# Patient Record
Sex: Female | Born: 2005 | Hispanic: Yes | Marital: Single | Smoking: Never smoker
Health system: Southern US, Community
[De-identification: ages and names within clinical notes are randomized; demographics above are authoritative.]

## PROBLEM LIST (undated history)

## (undated) HISTORY — PX: NO PAST SURGERIES: SHX2092

---
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
## 2024-03-21 ENCOUNTER — Encounter: Payer: Self-pay | Admitting: Neurology

## 2024-05-19 ENCOUNTER — Encounter: Payer: MEDICAID | Admitting: Certified Nurse Midwife

## 2024-05-19 DIAGNOSIS — N924 Excessive bleeding in the premenopausal period: Secondary | ICD-10-CM

## 2024-05-21 ENCOUNTER — Encounter: Payer: Self-pay | Admitting: Certified Nurse Midwife

## 2024-08-11 NOTE — Progress Notes (Deleted)
   NEUROLOGY CONSULTATION NOTE  Sarah Schwartz MRN: [id number] DOB: 11-May-2006  Referring provider: Verneita Blade, PA-C Primary care provider: ***  Reason for consult:  headache  Assessment/Plan:   ***   Subjective:  Sarah Schwartz is a 18 year old ***-handed female who presents for headaches.  History supplemented by referring provider's note.  Onset:  *** Location:  *** Quality:  *** Intensity:  ***.  Aura:  *** Prodrome:  *** Postdrome:  *** Associated symptoms:  ***.  *** denies associated unilateral numbness or weakness. Duration:  *** Frequency:  *** Frequency of abortive medication: *** Triggers:  *** Relieving factors:  *** Activity:  ***  Past NSAIDS/analgesics:  *** Past abortive triptans:  *** Past abortive ergotamine:  *** Past muscle relaxants:  *** Past anti-emetic:  *** Past antihypertensive medications:  *** Past antidepressant medications:  *** Past anticonvulsant medications:  *** Past anti-CGRP:  *** Past vitamins/Herbal/Supplements:  *** Past antihistamines/decongestants:  *** Other past therapies:  ***  Current NSAIDS/analgesics:  *** Current triptans:  *** Current ergotamine:  *** Current anti-emetic:  *** Current muscle relaxants:  *** Current Antihypertensive medications:  *** Current Antidepressant medications:  *** Current Anticonvulsant medications:  *** Current anti-CGRP:  *** Current Vitamins/Herbal/Supplements:  *** Current Antihistamines/Decongestants:  *** Other therapy:  *** Birth control:  *** Other medications:  ***   Caffeine:  *** Alcohol:  *** Smoker:  *** Diet:  *** Exercise:  *** Depression:  ***; Anxiety:  *** Other pain:  *** Sleep hygiene:  ***  History of TBI/concussion:  *** Family history of headache:  *** Family history of cerebral aneurysm:  ***      PAST MEDICAL HISTORY: No past medical history on file.  PAST SURGICAL HISTORY: Past Surgical History:  Procedure Laterality Date   NO  PAST SURGERIES      MEDICATIONS: Current Outpatient Medications on File Prior to Visit  Medication Sig Dispense Refill   FEROSUL 325 (65 Fe) MG tablet Take 325 mg by mouth daily.     No current facility-administered medications on file prior to visit.    ALLERGIES: Allergies  Allergen Reactions   Albuterol Rash    FAMILY HISTORY: No family history on file.  Objective:  *** General: No acute distress.  Patient appears well-groomed.   Head:  Normocephalic/atraumatic Eyes:  fundi examined but not visualized Neck: supple, no paraspinal tenderness, full range of motion Heart: regular rate and rhythm Neurological Exam: Mental status: alert and oriented to person, place, and time, speech fluent and not dysarthric, language intact. Cranial nerves: CN I: not tested CN II: pupils equal, round and reactive to light, visual fields intact CN III, IV, VI:  full range of motion, no nystagmus, no ptosis CN V: facial sensation intact. CN VII: upper and lower face symmetric CN VIII: hearing intact CN IX, X: gag intact, uvula midline CN XI: sternocleidomastoid and trapezius muscles intact CN XII: tongue midline Bulk & Tone: normal, no fasciculations. Motor:  muscle strength 5/5 throughout Sensation:  Pinprick and vibratory sensation intact. Deep Tendon Reflexes:  2+ throughout,  toes downgoing.   Finger to nose testing:  Without dysmetria.   Gait:  Normal station and stride.  Romberg negative.    Thank you for allowing me to take part in the care of this patient.  Juliene Dunnings, DO  CC: ***

## 2024-08-12 ENCOUNTER — Ambulatory Visit: Payer: MEDICAID | Admitting: Neurology

## 2024-08-12 ENCOUNTER — Encounter: Payer: Self-pay | Admitting: Neurology

## 2024-09-19 ENCOUNTER — Ambulatory Visit
Admission: EM | Admit: 2024-09-19 | Discharge: 2024-09-19 | Disposition: A | Discharge status: Home / Self Care | Payer: Worker's Compensation | Attending: Physician Assistant | Admitting: Physician Assistant

## 2024-09-19 DIAGNOSIS — M546 Pain in thoracic spine: Secondary | ICD-10-CM | POA: Diagnosis not present

## 2024-09-19 DIAGNOSIS — Y990 Civilian activity done for income or pay: Secondary | ICD-10-CM

## 2024-09-19 MED ORDER — NAPROXEN 500 MG PO TABS: 500.0000 mg | ORAL_TABLET | Freq: Two times a day (BID) | ORAL | 0 refills | Status: AC | PRN

## 2024-09-19 NOTE — ED Provider Notes (Signed)
 [facility]    CSN: [id number] Arrival date & time: 09/19/24  1719      History   Chief Complaint Chief Complaint  Patient presents with   Back Pain    HPI Sarah Schwartz is a 18 y.o. female presenting for upper back pain.  Patient reports injury that occurred at work today.  She states she was pulling a cart that had medical counts on it.  She reports another employee was pushing her cart and the patient got hit in between by both carts.  She reports this occurred at 10:30 AM and she finished her shift at 4 PM.  She reports that she was hurting all day after happened.  She still reports pain now that is moderate and mostly in the upper to mid back.  No radiation of pain to extremities, numbness, tingling, chest pain, breathing problem.  She has increased pain with any movement and standing.  She has not taken anything for pain.  No bruising, swelling, wounds.  No other complaints.  HPI  History reviewed. No pertinent past medical history.  Patient Active Problem List   Diagnosis Date Noted   Severe recurrent major depression without psychotic features (HCC) 09/25/2018    Past Surgical History:  Procedure Laterality Date   NO PAST SURGERIES      OB History   No obstetric history on file.      Home Medications    Prior to Admission medications   Medication Sig Start Date End Date Taking? Authorizing Provider  naproxen  (NAPROSYN ) 500 MG tablet Take 1 tablet (500 mg total) by mouth 2 (two) times daily as needed for moderate pain (pain score 4-6). 09/19/24  Yes Arvis Jolan NOVAK, PA-C  FEROSUL 325 (65 Fe) MG tablet Take 325 mg by mouth daily. 02/26/23   [provider]    Family History History reviewed. No pertinent family history.  Social History Social History   Tobacco Use   Smoking status: Never    Passive exposure: Never   Smokeless tobacco: Never  Vaping Use   Vaping status: Never Used  Substance Use Topics   Alcohol use: Never    Drug use: Never     Allergies   Albuterol   Review of Systems Review of Systems  Respiratory:  Negative for shortness of breath.   Cardiovascular:  Negative for chest pain.  Gastrointestinal:  Negative for abdominal pain and vomiting.  Musculoskeletal:  Positive for back pain. Negative for gait problem and joint swelling.  Skin:  Negative for color change and wound.  Neurological:  Negative for weakness and numbness.     Physical Exam Triage Vital Signs ED Triage Vitals  Encounter Vitals Group     BP 09/19/24 1735 128/81     Girls Systolic BP Percentile --      Girls Diastolic BP Percentile --      Boys Systolic BP Percentile --      Boys Diastolic BP Percentile --      Pulse Rate 09/19/24 1735 85     Resp 09/19/24 1735 18     Temp 09/19/24 1735 98.4 F (36.9 C)     Temp Source 09/19/24 1735 Oral     SpO2 09/19/24 1735 100 %     Weight 09/19/24 1734 127 lb 3.2 oz (57.7 kg)     Height --      Head Circumference --      Peak Flow --      Pain Score 09/19/24  1733 5     Pain Loc --      Pain Education --      Exclude from Growth Chart --    No data found.  Updated Vital Signs BP 128/81 (BP Location: Left Arm)   Pulse 85   Temp 98.4 F (36.9 C) (Oral)   Resp 18   Wt 127 lb 3.2 oz (57.7 kg)   LMP 09/02/2024   SpO2 100%    Physical Exam Vitals and nursing note reviewed.  Constitutional:      General: She is not in acute distress.    Appearance: Normal appearance. She is not ill-appearing or toxic-appearing.  HENT:     Head: Normocephalic and atraumatic.  Eyes:     General: No scleral icterus.       Right eye: No discharge.        Left eye: No discharge.     Conjunctiva/sclera: Conjunctivae normal.  Cardiovascular:     Rate and Rhythm: Normal rate and regular rhythm.     Heart sounds: Normal heart sounds.  Pulmonary:     Effort: Pulmonary effort is normal. No respiratory distress.     Breath sounds: Normal breath sounds.  Abdominal:     Palpations:  Abdomen is soft.     Tenderness: There is no abdominal tenderness.  Musculoskeletal:     Cervical back: Neck supple.     Comments: Back: There is no swelling, contusions, wounds or appreciable visible injury of entire back.  She has tenderness diffusely throughout thoracic region.  No tenderness of lumbar region.  Reduced range of motion with forward flexion.  Full strength in upper and lower extremities.  Skin:    General: Skin is dry.  Neurological:     General: No focal deficit present.     Mental Status: She is alert. Mental status is at baseline.     Motor: No weakness.     Gait: Gait normal.  Psychiatric:        Mood and Affect: Mood normal.        Behavior: Behavior normal.      UC Treatments / Results  Labs (all labs ordered are listed, but only abnormal results are displayed) Labs Reviewed - No data to display  EKG   Radiology No results found.  Procedures Procedures (including critical care time)  Medications Ordered in UC Medications - No data to display  Initial Impression / Assessment and Plan / UC Course  I have reviewed the triage vital signs and the nursing notes.  Pertinent labs & imaging results that were available during my care of the patient were reviewed by me and considered in my medical decision making (see chart for details).   18 year old female presents for upper back pain after getting pinned between 2 carts at work today.  She denies falling.  She has not taken any for pain.  No numbness, weakness or tingling.  On evaluation there is no visible injury or trauma.  No significant spinal tenderness.  Generalized tenderness of thoracic region.  Reduced range of motion of upper back.  Full strength of upper and lower extremities.  Acute back pain.  Mild injury.  Sent naproxen .  Advised may also take Tylenol  and use muscle rubs.  Return to work on Monday as scheduled without restriction.  Advised patient if symptoms are not improved by then or  symptoms worsen she should follow-up with occupational health.  ED precautions discussed.   Final Clinical Impressions(s) / UC Diagnoses  Final diagnoses:  Acute thoracic back pain, unspecified back pain laterality  Work related injury     Discharge Instructions      BACK PAIN: Stressed avoiding painful activities . RICE (REST, ICE, COMPRESSION, ELEVATION) guidelines reviewed. May alternate ice and heat. Consider use of muscle rubs, Salonpas patches, etc. Use medications as directed including muscle relaxers if prescribed. Take anti-inflammatory medications as prescribed or OTC NSAIDs/Tylenol .  F/u with occupational health if pain continues after the weekend.  BACK PAIN RED FLAGS: If the back pain acutely worsens or there are any red flag symptoms such as numbness/tingling, leg weakness, saddle anesthesia, or loss of bowel/bladder control, go immediately to the ER. Follow up with us  as scheduled or sooner if the pain does not begin to resolve or if it worsens before the follow up    Follow up with Katherine McManama, NP [address] [place], [place] [address]  Call to make appointment at [phone]  Clinic hours: 8a-5p M-F     ED Prescriptions     Medication Sig Dispense Auth. Provider   naproxen  (NAPROSYN ) 500 MG tablet Take 1 tablet (500 mg total) by mouth 2 (two) times daily as needed for moderate pain (pain score 4-6). 20 tablet Samanvi Cuccia B, PA-C      PDMP not reviewed this encounter.   Arvis Jolan NOVAK, PA-C 09/19/24 [phone]

## 2024-09-19 NOTE — ED Triage Notes (Signed)
 Pt c/o  Pt states that while at work, she was pulling a heavy cart, and was struck by another cart pushed by another worker and pressed between the cart she was pulling and the other cart.   Pt states that the accident happened at 1030am  Pt states that her back feels like it wants to snap and she has constant pressure when standing.

## 2024-09-19 NOTE — Discharge Instructions (Addendum)
 BACK PAIN: Stressed avoiding painful activities . RICE (REST, ICE, COMPRESSION, ELEVATION) guidelines reviewed. May alternate ice and heat. Consider use of muscle rubs, Salonpas patches, etc. Use medications as directed including muscle relaxers if prescribed. Take anti-inflammatory medications as prescribed or OTC NSAIDs/Tylenol .  F/u with occupational health if pain continues after the weekend.  BACK PAIN RED FLAGS: If the back pain acutely worsens or there are any red flag symptoms such as numbness/tingling, leg weakness, saddle anesthesia, or loss of bowel/bladder control, go immediately to the ER. Follow up with us  as scheduled or sooner if the pain does not begin to resolve or if it worsens before the follow up    Follow up with Katherine McManama, NP [address] [place], [place] [address]  Call to make appointment at [phone]  Clinic hours: 8a-5p M-F
